# Patient Record
Sex: Male | Born: 1970 | Race: White | Hispanic: No | Marital: Married | Smoking: Never smoker
Health system: Southern US, Community
[De-identification: ages and names within clinical notes are randomized; demographics above are authoritative.]

---

## 1999-12-23 ENCOUNTER — Emergency Department (HOSPITAL_COMMUNITY): Admission: EM | Admit: 1999-12-23 | Discharge: 1999-12-23 | Payer: Self-pay | Admitting: Emergency Medicine

## 2016-01-07 ENCOUNTER — Emergency Department (HOSPITAL_BASED_OUTPATIENT_CLINIC_OR_DEPARTMENT_OTHER)
Admission: EM | Admit: 2016-01-07 | Discharge: 2016-01-07 | Disposition: A | Discharge status: Home / Self Care | Payer: 59 | Attending: Emergency Medicine | Admitting: Emergency Medicine

## 2016-01-07 ENCOUNTER — Encounter (HOSPITAL_BASED_OUTPATIENT_CLINIC_OR_DEPARTMENT_OTHER): Payer: Self-pay | Admitting: Emergency Medicine

## 2016-01-07 DIAGNOSIS — L03011 Cellulitis of right finger: Secondary | ICD-10-CM | POA: Insufficient documentation

## 2016-01-07 DIAGNOSIS — R0789 Other chest pain: Secondary | ICD-10-CM | POA: Diagnosis present

## 2016-01-07 DIAGNOSIS — R002 Palpitations: Secondary | ICD-10-CM | POA: Insufficient documentation

## 2016-01-07 LAB — CBC WITH DIFFERENTIAL/PLATELET
Basophils Absolute: 0 10*3/uL (ref 0.0–0.1)
Basophils Relative: 0 %
EOS PCT: 3 %
Eosinophils Absolute: 0.1 10*3/uL (ref 0.0–0.7)
HEMATOCRIT: 42.8 % (ref 39.0–52.0)
Hemoglobin: 15 g/dL (ref 13.0–17.0)
Lymphocytes Relative: 21 %
Lymphs Abs: 0.8 10*3/uL (ref 0.7–4.0)
MCH: 30.7 pg (ref 26.0–34.0)
MCHC: 35 g/dL (ref 30.0–36.0)
MCV: 87.7 fL (ref 78.0–100.0)
MONO ABS: 0.5 10*3/uL (ref 0.1–1.0)
MONOS PCT: 13 %
NEUTROS PCT: 63 %
Neutro Abs: 2.5 10*3/uL (ref 1.7–7.7)
PLATELETS: 113 10*3/uL — AB (ref 150–400)
RBC: 4.88 MIL/uL (ref 4.22–5.81)
RDW: 13.1 % (ref 11.5–15.5)
WBC: 3.9 10*3/uL — AB (ref 4.0–10.5)

## 2016-01-07 LAB — TROPONIN I: Troponin I: <0.03 ng/mL (ref <0.03)

## 2016-01-07 LAB — BASIC METABOLIC PANEL
ANION GAP: 15 (ref 5–15)
BUN: 8 mg/dL (ref 6–20)
CHLORIDE: 98 mmol/L — AB (ref 101–111)
CO2: 24 mmol/L (ref 22–32)
Calcium: 9.3 mg/dL (ref 8.9–10.3)
Creatinine, Ser: 0.76 mg/dL (ref 0.61–1.24)
GFR calc non Af Amer: >60 mL/min (ref >60)
Glucose, Bld: 111 mg/dL — ABNORMAL HIGH (ref 65–99)
POTASSIUM: 3.9 mmol/L (ref 3.5–5.1)
SODIUM: 137 mmol/L (ref 135–145)

## 2016-01-07 MED ORDER — CEPHALEXIN 500 MG PO CAPS: 500.0000 mg | ORAL_CAPSULE | Freq: Four times a day (QID) | ORAL | Status: DC

## 2016-01-07 MED ORDER — LORAZEPAM 2 MG/ML IJ SOLN
1.0000 mg | Freq: Once | INTRAMUSCULAR | Status: AC
  Administered 2016-01-07: 1 mg via INTRAVENOUS
  Filled 2016-01-07: qty 1

## 2016-01-07 MED ORDER — LORAZEPAM 1 MG PO TABS: 1.0000 mg | ORAL_TABLET | Freq: Three times a day (TID) | ORAL | Status: DC | PRN

## 2016-01-07 NOTE — Discharge Instructions (Signed)
Keflex as prescribed.  Ativan as prescribed as needed.  Return to the emergency department if you develop severe chest pain, difficulty breathing, or other new and concerning symptoms.   Palpitations A palpitation is the feeling that your heartbeat is irregular or is faster than normal. It may feel like your heart is fluttering or skipping a beat. Palpitations are usually not a serious problem. However, in some cases, you may need further medical evaluation. CAUSES  Palpitations can be caused by:  Smoking.  Caffeine or other stimulants, such as diet pills or energy drinks.  Alcohol.  Stress and anxiety.  Strenuous physical activity.  Fatigue.  Certain medicines.  Heart disease, especially if you have a history of irregular heart rhythms (arrhythmias), such as atrial fibrillation, atrial flutter, or supraventricular tachycardia.  An improperly working pacemaker or defibrillator. DIAGNOSIS  To find the cause of your palpitations, your health care provider will take your medical history and perform a physical exam. Your health care provider may also have you take a test called an ambulatory electrocardiogram (ECG). An ECG records your heartbeat patterns over a 24-hour period. You may also have other tests, such as:  Transthoracic echocardiogram (TTE). During echocardiography, sound waves are used to evaluate how blood flows through your heart.  Transesophageal echocardiogram (TEE).  Cardiac monitoring. This allows your health care provider to monitor your heart rate and rhythm in real time.  Holter monitor. This is a portable device that records your heartbeat and can help diagnose heart arrhythmias. It allows your health care provider to track your heart activity for several days, if needed.  Stress tests by exercise or by giving medicine that makes the heart beat faster. TREATMENT  Treatment of palpitations depends on the cause of your symptoms and can vary greatly. Most  cases of palpitations do not require any treatment other than time, relaxation, and monitoring your symptoms. Other causes, such as atrial fibrillation, atrial flutter, or supraventricular tachycardia, usually require further treatment. HOME CARE INSTRUCTIONS   Avoid:  Caffeinated coffee, tea, soft drinks, diet pills, and energy drinks.  Chocolate.  Alcohol.  Stop smoking if you smoke.  Reduce your stress and anxiety. Things that can help you relax include:  A method of controlling things in your body, such as your heartbeats, with your mind (biofeedback).  Yoga.  Meditation.  Physical activity such as swimming, jogging, or walking.  Get plenty of rest and sleep. SEEK MEDICAL CARE IF:   You continue to have a fast or irregular heartbeat beyond 24 hours.  Your palpitations occur more often. SEEK IMMEDIATE MEDICAL CARE IF:  You have chest pain or shortness of breath.  You have a severe headache.  You feel dizzy or you faint. MAKE SURE YOU:  Understand these instructions.  Will watch your condition.  Will get help right away if you are not doing well or get worse.   This information is not intended to replace advice given to you by your health care provider. Make sure you discuss any questions you have with your health care provider.   Document Released: 06/20/2000 Document Revised: 06/28/2013 Document Reviewed: 08/22/2011 Elsevier Interactive Patient Education 2016 Else[employer] Inc[employer]  Cellulitis Cellulitis is an infection of the skin and the tissue beneath it. The infected area is usually red and tender. Cellulitis occurs most often in the arms and lower legs.  CAUSES  Cellulitis is caused by bacteria that enter the skin through cracks or cuts in the skin. The most common types of bacteria that  cause cellulitis are staphylococci and streptococci. SIGNS AND SYMPTOMS   Redness and warmth.  Swelling.  Tenderness or pain.  Fever. DIAGNOSIS  Your health care  provider can usually determine what is wrong based on a physical exam. Blood tests may also be done. TREATMENT  Treatment usually involves taking an antibiotic medicine. HOME CARE INSTRUCTIONS   Take your antibiotic medicine as directed by your health care provider. Finish the antibiotic even if you start to feel better.  Keep the infected arm or leg elevated to reduce swelling.  Apply a warm cloth to the affected area up to 4 times per day to relieve pain.  Take medicines only as directed by your health care provider.  Keep all follow-up visits as directed by your health care provider. SEEK MEDICAL CARE IF:   You notice red streaks coming from the infected area.  Your red area gets larger or turns dark in color.  Your bone or joint underneath the infected area becomes painful after the skin has healed.  Your infection returns in the same area or another area.  You notice a swollen bump in the infected area.  You develop new symptoms.  You have a fever. SEEK IMMEDIATE MEDICAL CARE IF:   You feel very sleepy.  You develop vomiting or diarrhea.  You have a general ill feeling (malaise) with muscle aches and pains.   This information is not intended to replace advice given to you by your health care provider. Make sure you discuss any questions you have with your health care provider.   Document Released: 04/02/2005 Document Revised: 03/14/2015 Document Reviewed: 09/08/2011 Elsevier Interactive Patient Education [employer] Inc[employer]

## 2016-01-07 NOTE — ED Notes (Addendum)
Patient reports feeling shaky all over as well as c/o chest pain.  Reports this started this morning and has caused some shortness of breath. States he did not have coffee or energy drinks this morning and only drank water this morning.  Reports returning from vacation yesterday-flew from [place].  Reports he has been checking his heart rate with his watch and it has "been jumping all over the place".  Patient states "it feels like I drank too much alcohol".  States he did drink in excess on vacation but only had 2 beers at the airport yesterday.

## 2016-01-07 NOTE — ED Provider Notes (Signed)
CSN: [id number]     Arrival date & time 01/07/16  [id number] History   First MD Initiated Contact with Patient 01/07/16 [phone]     Chief Complaint  Patient presents with  . Chest Pain     (Consider location/radiation/quality/duration/timing/severity/associated sxs/prior Treatment) HPI Comments: Patient is a 45 year old male with no significant past medical history. He presents for evaluation of palpitations and tightness in his chest. He reports feeling anxious, unsettled, and shaky all over. This started approximately 3:00 this morning. Patient reports he spent the week in [place] visiting family and returned home yesterday. While he was there, he reports drinking in excess of 10 beers per day. He denies daily alcohol use otherwise. He is concerned he may have drank too much while he was away. He denies any shortness of breath, nausea, diaphoresis, or radiation to the arm or jaw. He denies any recent exertional symptoms. He has no cardiac history, no cardiac risk factors, and reports mountain biking several times weekly with no chest pain or dyspnea.  Patient is a 45 y.o. male presenting with chest pain. The history is provided by the patient.  Chest Pain Pain location:  Substernal area Pain quality: tightness   Pain radiates to:  Does not radiate Pain radiates to the back: no   Pain severity:  Mild Timing:  Constant Progression:  Unchanged Chronicity:  New Relieved by:  Nothing Worsened by:  Nothing tried Ineffective treatments:  None tried   History reviewed. No pertinent past medical history. History reviewed. No pertinent past surgical history. History reviewed. No pertinent family history. Social History  Substance Use Topics  . Smoking status: Never Smoker   . Smokeless tobacco: None  . Alcohol Use: Yes     Comment: 6-8 beers weekly    Review of Systems  Cardiovascular: Positive for chest pain.  All other systems reviewed and are negative.     Allergies  Review of  patient's allergies indicates no known allergies.  Home Medications   Prior to Admission medications   Not on File   BP 159/76 mmHg  Pulse 80  Temp(Src) 98.6 F (37 C) (Oral)  Resp 18  Ht 5\' 9"  (1.753 m)  Wt 142 lb (64.411 kg)  BMI 20.96 kg/m2  SpO2 100% Physical Exam  Constitutional: He is oriented to person, place, and time. He appears well-developed and well-nourished. No distress.  He does appear somewhat shaky and anxious.  HENT:  Head: Normocephalic and atraumatic.  Mouth/Throat: Oropharynx is clear and moist.  Neck: Normal range of motion. Neck supple.  Cardiovascular: Normal rate and regular rhythm.  Exam reveals no friction rub.   No murmur heard. Pulmonary/Chest: Effort normal and breath sounds normal. No respiratory distress. He has no wheezes. He has no rales.  Abdominal: Soft. Bowel sounds are normal. He exhibits no distension. There is no tenderness.  Musculoskeletal: Normal range of motion. He exhibits no edema.  Neurological: He is alert and oriented to person, place, and time. No cranial nerve deficit. He exhibits normal muscle tone. Coordination normal.  Skin: Skin is warm and dry. He is not diaphoretic.  Nursing note and vitals reviewed.   ED Course  Procedures (including critical care time) Labs Review Labs Reviewed  BASIC METABOLIC PANEL  CBC WITH DIFFERENTIAL/PLATELET  TROPONIN I    Imaging Review No results found. I have personally reviewed and evaluated these images and lab results as part of my medical decision-making.   EKG Interpretation   Date/Time:  Monday January 07 2016 09:32:49  EDT Ventricular Rate:  81 PR Interval:    QRS Duration: 94 QT Interval:  390 QTC Calculation: 453 R Axis:   90 Text Interpretation:  Sinus rhythm Borderline right axis deviation  Confirmed by Everlynn Sagun  MD, Dyke Weible ([id number]) on 01/07/2016 9:41:40 AM      MDM   Final diagnoses:  None    Patient presents with complaints of palpitations and feeling shaky. He  also feels tight in the chest. He returned yesterday from a vacation during which he reports drinking approximately 10 beers per day. He has had no ectopy while in the emergency department and he has been in a sinus rhythm. I highly doubt a cardiac etiology and do not feel as though any further workup is indicated into this. His symptoms are most likely related to alcohol withdrawal as he is feeling much better after dose of Ativan. He will be discharged with Ativan which she can take if his symptoms recur. I will also prescribe Keflex as he is reporting redness in his right thumb. He also feels as though there may be a splinter there. He does have a small hold the tip of the right thumb, however I am unable to appreciate any foreign body.    Geoffery Lyonsouglas Charlette Hennings, MD 01/07/16 [phone]

## 2016-10-27 ENCOUNTER — Emergency Department (HOSPITAL_BASED_OUTPATIENT_CLINIC_OR_DEPARTMENT_OTHER): Payer: 59

## 2016-10-27 ENCOUNTER — Emergency Department (HOSPITAL_BASED_OUTPATIENT_CLINIC_OR_DEPARTMENT_OTHER)
Admission: EM | Admit: 2016-10-27 | Discharge: 2016-10-27 | Disposition: A | Discharge status: Home / Self Care | Payer: 59 | Attending: Emergency Medicine | Admitting: Emergency Medicine

## 2016-10-27 ENCOUNTER — Encounter (HOSPITAL_BASED_OUTPATIENT_CLINIC_OR_DEPARTMENT_OTHER): Payer: Self-pay | Admitting: Emergency Medicine

## 2016-10-27 DIAGNOSIS — S5011XA Contusion of right forearm, initial encounter: Secondary | ICD-10-CM | POA: Insufficient documentation

## 2016-10-27 DIAGNOSIS — Y999 Unspecified external cause status: Secondary | ICD-10-CM | POA: Diagnosis not present

## 2016-10-27 DIAGNOSIS — S6991XA Unspecified injury of right wrist, hand and finger(s), initial encounter: Secondary | ICD-10-CM | POA: Diagnosis present

## 2016-10-27 DIAGNOSIS — Y939 Activity, unspecified: Secondary | ICD-10-CM | POA: Insufficient documentation

## 2016-10-27 DIAGNOSIS — Y92410 Unspecified street and highway as the place of occurrence of the external cause: Secondary | ICD-10-CM | POA: Insufficient documentation

## 2016-10-27 DIAGNOSIS — M546 Pain in thoracic spine: Secondary | ICD-10-CM | POA: Insufficient documentation

## 2016-10-27 DIAGNOSIS — F17220 Nicotine dependence, chewing tobacco, uncomplicated: Secondary | ICD-10-CM | POA: Insufficient documentation

## 2016-10-27 DIAGNOSIS — S60221A Contusion of right hand, initial encounter: Secondary | ICD-10-CM

## 2016-10-27 NOTE — Discharge Instructions (Signed)
Read the information below.  You may return to the Emergency Department at any time for worsening condition or any new symptoms that concern you.  If you develop redness, swelling, pus draining from the wound, or fevers greater than 100.4, return to the ER immediately for a recheck.   °
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
## 2017-03-11 DIAGNOSIS — M25511 Pain in right shoulder: Secondary | ICD-10-CM | POA: Diagnosis not present

## 2017-03-11 DIAGNOSIS — M9901 Segmental and somatic dysfunction of cervical region: Secondary | ICD-10-CM | POA: Diagnosis not present

## 2017-03-16 DIAGNOSIS — M25511 Pain in right shoulder: Secondary | ICD-10-CM | POA: Diagnosis not present

## 2017-03-16 DIAGNOSIS — M9901 Segmental and somatic dysfunction of cervical region: Secondary | ICD-10-CM | POA: Diagnosis not present

## 2017-03-23 DIAGNOSIS — M9901 Segmental and somatic dysfunction of cervical region: Secondary | ICD-10-CM | POA: Diagnosis not present

## 2017-03-23 DIAGNOSIS — M25511 Pain in right shoulder: Secondary | ICD-10-CM | POA: Diagnosis not present

## 2017-03-25 DIAGNOSIS — M25511 Pain in right shoulder: Secondary | ICD-10-CM | POA: Diagnosis not present

## 2017-03-25 DIAGNOSIS — M9901 Segmental and somatic dysfunction of cervical region: Secondary | ICD-10-CM | POA: Diagnosis not present

## 2017-03-30 DIAGNOSIS — M25511 Pain in right shoulder: Secondary | ICD-10-CM | POA: Diagnosis not present

## 2017-03-30 DIAGNOSIS — M9901 Segmental and somatic dysfunction of cervical region: Secondary | ICD-10-CM | POA: Diagnosis not present

## 2017-04-01 DIAGNOSIS — M25511 Pain in right shoulder: Secondary | ICD-10-CM | POA: Diagnosis not present

## 2017-04-01 DIAGNOSIS — M9901 Segmental and somatic dysfunction of cervical region: Secondary | ICD-10-CM | POA: Diagnosis not present

## 2017-04-02 DIAGNOSIS — M25511 Pain in right shoulder: Secondary | ICD-10-CM | POA: Diagnosis not present

## 2017-04-02 DIAGNOSIS — M9901 Segmental and somatic dysfunction of cervical region: Secondary | ICD-10-CM | POA: Diagnosis not present

## 2017-04-06 DIAGNOSIS — M9901 Segmental and somatic dysfunction of cervical region: Secondary | ICD-10-CM | POA: Diagnosis not present

## 2017-04-06 DIAGNOSIS — M25511 Pain in right shoulder: Secondary | ICD-10-CM | POA: Diagnosis not present

## 2017-04-08 DIAGNOSIS — M9901 Segmental and somatic dysfunction of cervical region: Secondary | ICD-10-CM | POA: Diagnosis not present

## 2017-04-08 DIAGNOSIS — M25511 Pain in right shoulder: Secondary | ICD-10-CM | POA: Diagnosis not present

## 2017-04-13 DIAGNOSIS — M9901 Segmental and somatic dysfunction of cervical region: Secondary | ICD-10-CM | POA: Diagnosis not present

## 2017-04-13 DIAGNOSIS — M25511 Pain in right shoulder: Secondary | ICD-10-CM | POA: Diagnosis not present

## 2017-04-16 DIAGNOSIS — M9901 Segmental and somatic dysfunction of cervical region: Secondary | ICD-10-CM | POA: Diagnosis not present

## 2017-04-16 DIAGNOSIS — M25511 Pain in right shoulder: Secondary | ICD-10-CM | POA: Diagnosis not present

## 2017-04-20 DIAGNOSIS — M25511 Pain in right shoulder: Secondary | ICD-10-CM | POA: Diagnosis not present

## 2017-04-20 DIAGNOSIS — M9901 Segmental and somatic dysfunction of cervical region: Secondary | ICD-10-CM | POA: Diagnosis not present

## 2017-04-23 DIAGNOSIS — M25511 Pain in right shoulder: Secondary | ICD-10-CM | POA: Diagnosis not present

## 2017-04-23 DIAGNOSIS — M9901 Segmental and somatic dysfunction of cervical region: Secondary | ICD-10-CM | POA: Diagnosis not present

## 2017-04-28 DIAGNOSIS — M9901 Segmental and somatic dysfunction of cervical region: Secondary | ICD-10-CM | POA: Diagnosis not present

## 2017-04-28 DIAGNOSIS — M25511 Pain in right shoulder: Secondary | ICD-10-CM | POA: Diagnosis not present

## 2017-05-04 DIAGNOSIS — M25511 Pain in right shoulder: Secondary | ICD-10-CM | POA: Diagnosis not present

## 2017-05-04 DIAGNOSIS — M9901 Segmental and somatic dysfunction of cervical region: Secondary | ICD-10-CM | POA: Diagnosis not present

## 2017-05-07 DIAGNOSIS — M25511 Pain in right shoulder: Secondary | ICD-10-CM | POA: Diagnosis not present

## 2017-05-07 DIAGNOSIS — M9901 Segmental and somatic dysfunction of cervical region: Secondary | ICD-10-CM | POA: Diagnosis not present

## 2017-05-20 DIAGNOSIS — M25511 Pain in right shoulder: Secondary | ICD-10-CM | POA: Diagnosis not present

## 2017-05-20 DIAGNOSIS — M9901 Segmental and somatic dysfunction of cervical region: Secondary | ICD-10-CM | POA: Diagnosis not present

## 2017-06-03 DIAGNOSIS — M25511 Pain in right shoulder: Secondary | ICD-10-CM | POA: Diagnosis not present

## 2017-06-03 DIAGNOSIS — M9901 Segmental and somatic dysfunction of cervical region: Secondary | ICD-10-CM | POA: Diagnosis not present

## 2017-07-28 DIAGNOSIS — J0140 Acute pansinusitis, unspecified: Secondary | ICD-10-CM | POA: Diagnosis not present

## 2017-07-28 DIAGNOSIS — H6503 Acute serous otitis media, bilateral: Secondary | ICD-10-CM | POA: Diagnosis not present

## 2017-11-18 DIAGNOSIS — R55 Syncope and collapse: Secondary | ICD-10-CM | POA: Diagnosis not present

## 2017-11-18 DIAGNOSIS — R7989 Other specified abnormal findings of blood chemistry: Secondary | ICD-10-CM | POA: Diagnosis not present

## 2017-11-18 DIAGNOSIS — Z6822 Body mass index (BMI) 22.0-22.9, adult: Secondary | ICD-10-CM | POA: Diagnosis not present

## 2017-11-18 DIAGNOSIS — Z8549 Personal history of malignant neoplasm of other male genital organs: Secondary | ICD-10-CM | POA: Diagnosis not present

## 2018-02-18 DIAGNOSIS — M25552 Pain in left hip: Secondary | ICD-10-CM | POA: Diagnosis not present

## 2018-02-18 DIAGNOSIS — M545 Low back pain: Secondary | ICD-10-CM | POA: Diagnosis not present

## 2018-02-26 DIAGNOSIS — S0033XA Contusion of nose, initial encounter: Secondary | ICD-10-CM | POA: Diagnosis not present

## 2018-02-26 DIAGNOSIS — S0121XA Laceration without foreign body of nose, initial encounter: Secondary | ICD-10-CM | POA: Diagnosis not present

## 2018-02-26 DIAGNOSIS — G501 Atypical facial pain: Secondary | ICD-10-CM | POA: Diagnosis not present

## 2018-02-26 DIAGNOSIS — S0992XA Unspecified injury of nose, initial encounter: Secondary | ICD-10-CM | POA: Diagnosis not present

## 2018-07-13 DIAGNOSIS — Z0000 Encounter for general adult medical examination without abnormal findings: Secondary | ICD-10-CM | POA: Diagnosis not present

## 2018-07-13 DIAGNOSIS — Z125 Encounter for screening for malignant neoplasm of prostate: Secondary | ICD-10-CM | POA: Diagnosis not present

## 2018-07-20 DIAGNOSIS — Z8249 Family history of ischemic heart disease and other diseases of the circulatory system: Secondary | ICD-10-CM | POA: Diagnosis not present

## 2018-07-20 DIAGNOSIS — Z0000 Encounter for general adult medical examination without abnormal findings: Secondary | ICD-10-CM | POA: Diagnosis not present

## 2018-07-20 DIAGNOSIS — J302 Other seasonal allergic rhinitis: Secondary | ICD-10-CM | POA: Diagnosis not present

## 2018-07-20 DIAGNOSIS — R7989 Other specified abnormal findings of blood chemistry: Secondary | ICD-10-CM | POA: Diagnosis not present

## 2018-07-20 DIAGNOSIS — Z1389 Encounter for screening for other disorder: Secondary | ICD-10-CM | POA: Diagnosis not present

## 2018-07-20 DIAGNOSIS — E871 Hypo-osmolality and hyponatremia: Secondary | ICD-10-CM | POA: Diagnosis not present

## 2018-08-30 DIAGNOSIS — M25552 Pain in left hip: Secondary | ICD-10-CM | POA: Diagnosis not present

## 2018-09-07 DIAGNOSIS — M545 Low back pain: Secondary | ICD-10-CM | POA: Diagnosis not present

## 2018-09-09 DIAGNOSIS — M545 Low back pain: Secondary | ICD-10-CM | POA: Diagnosis not present
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
## 2018-10-26 DIAGNOSIS — M4316 Spondylolisthesis, lumbar region: Secondary | ICD-10-CM | POA: Diagnosis not present

## 2018-10-26 DIAGNOSIS — M545 Low back pain: Secondary | ICD-10-CM | POA: Diagnosis not present

## 2018-10-26 DIAGNOSIS — M5416 Radiculopathy, lumbar region: Secondary | ICD-10-CM | POA: Diagnosis not present

## 2018-12-12 ENCOUNTER — Emergency Department (HOSPITAL_COMMUNITY)
Admission: EM | Admit: 2018-12-12 | Discharge: 2018-12-12 | Disposition: A | Discharge status: Home / Self Care | Payer: BC Managed Care – PPO | Attending: Emergency Medicine | Admitting: Emergency Medicine

## 2018-12-12 ENCOUNTER — Emergency Department (HOSPITAL_COMMUNITY): Payer: BC Managed Care – PPO

## 2018-12-12 ENCOUNTER — Encounter (HOSPITAL_COMMUNITY): Payer: Self-pay

## 2018-12-12 ENCOUNTER — Other Ambulatory Visit: Payer: Self-pay

## 2018-12-12 DIAGNOSIS — F17220 Nicotine dependence, chewing tobacco, uncomplicated: Secondary | ICD-10-CM | POA: Diagnosis not present

## 2018-12-12 DIAGNOSIS — R7989 Other specified abnormal findings of blood chemistry: Secondary | ICD-10-CM

## 2018-12-12 DIAGNOSIS — R945 Abnormal results of liver function studies: Secondary | ICD-10-CM | POA: Diagnosis not present

## 2018-12-12 DIAGNOSIS — R55 Syncope and collapse: Secondary | ICD-10-CM | POA: Diagnosis not present

## 2018-12-12 DIAGNOSIS — R112 Nausea with vomiting, unspecified: Secondary | ICD-10-CM

## 2018-12-12 LAB — URINALYSIS, ROUTINE W REFLEX MICROSCOPIC
Bacteria, UA: NONE SEEN
Bilirubin Urine: NEGATIVE
Glucose, UA: NEGATIVE mg/dL
Hgb urine dipstick: NEGATIVE
Ketones, ur: 20 mg/dL — AB
Leukocytes,Ua: NEGATIVE
Nitrite: NEGATIVE
Protein, ur: 30 mg/dL — AB
Specific Gravity, Urine: 1.006 (ref 1.005–1.030)
pH: 8 (ref 5.0–8.0)

## 2018-12-12 LAB — BASIC METABOLIC PANEL
Anion gap: 17 — ABNORMAL HIGH (ref 5–15)
BUN: 5 mg/dL — ABNORMAL LOW (ref 6–20)
CO2: 22 mmol/L (ref 22–32)
Calcium: 9.6 mg/dL (ref 8.9–10.3)
Chloride: 88 mmol/L — ABNORMAL LOW (ref 98–111)
Creatinine, Ser: 0.6 mg/dL — ABNORMAL LOW (ref 0.61–1.24)
GFR calc Af Amer: >60 mL/min (ref >60)
GFR calc non Af Amer: >60 mL/min (ref >60)
Glucose, Bld: 121 mg/dL — ABNORMAL HIGH (ref 70–99)
Potassium: 3.6 mmol/L (ref 3.5–5.1)
Sodium: 127 mmol/L — ABNORMAL LOW (ref 135–145)

## 2018-12-12 LAB — HEPATIC FUNCTION PANEL
ALT: 159 U/L — ABNORMAL HIGH (ref 0–44)
AST: 214 U/L — ABNORMAL HIGH (ref 15–41)
Albumin: 5.1 g/dL — ABNORMAL HIGH (ref 3.5–5.0)
Alkaline Phosphatase: 80 U/L (ref 38–126)
Bilirubin, Direct: 0.4 mg/dL — ABNORMAL HIGH (ref 0.0–0.2)
Indirect Bilirubin: 1.2 mg/dL — ABNORMAL HIGH (ref 0.3–0.9)
Total Bilirubin: 1.6 mg/dL — ABNORMAL HIGH (ref 0.3–1.2)
Total Protein: 8.3 g/dL — ABNORMAL HIGH (ref 6.5–8.1)

## 2018-12-12 LAB — LIPASE, BLOOD: Lipase: 42 U/L (ref 11–51)

## 2018-12-12 LAB — CBC
HCT: 41 % (ref 39.0–52.0)
Hemoglobin: 14.7 g/dL (ref 13.0–17.0)
MCH: 31 pg (ref 26.0–34.0)
MCHC: 35.9 g/dL (ref 30.0–36.0)
MCV: 86.5 fL (ref 80.0–100.0)
Platelets: 146 10*3/uL — ABNORMAL LOW (ref 150–400)
RBC: 4.74 MIL/uL (ref 4.22–5.81)
RDW: 12.3 % (ref 11.5–15.5)
WBC: 6.1 10*3/uL (ref 4.0–10.5)
nRBC: 0 % (ref 0.0–0.2)

## 2018-12-12 LAB — CBG MONITORING, ED: Glucose-Capillary: 108 mg/dL — ABNORMAL HIGH (ref 70–99)

## 2018-12-12 MED ORDER — ONDANSETRON HCL 4 MG/2ML IJ SOLN
4.0000 mg | Freq: Once | INTRAMUSCULAR | Status: AC
  Administered 2018-12-12: 4 mg via INTRAVENOUS
  Filled 2018-12-12: qty 2

## 2018-12-12 MED ORDER — ONDANSETRON 4 MG PO TBDP: 4.0000 mg | ORAL_TABLET | Freq: Three times a day (TID) | ORAL | 0 refills | Status: AC | PRN

## 2018-12-12 MED ORDER — SODIUM CHLORIDE 0.9 % IV BOLUS
1000.0000 mL | Freq: Once | INTRAVENOUS | Status: AC
Start: 2018-12-12 — End: 2018-12-12
  Administered 2018-12-12: 1000 mL via INTRAVENOUS

## 2018-12-12 MED ORDER — SODIUM CHLORIDE 0.9% FLUSH: 3.0000 mL | Freq: Once | INTRAVENOUS | Status: DC

## 2018-12-12 MED ORDER — SODIUM CHLORIDE 0.9 % IV BOLUS
1000.0000 mL | Freq: Once | INTRAVENOUS | Status: AC
  Administered 2018-12-12: 1000 mL via INTRAVENOUS

## 2018-12-12 NOTE — ED Provider Notes (Signed)
[facility] Emergency Department Provider Note MRN:  [id number]  Arrival date & time: 12/12/18     Chief Complaint   Loss of Consciousness   History of Present Illness   Tyler Knight is a 48 y.o. year-old male with no pertinent past medical history presenting to the ED with chief complaint of loss of consciousness.  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  Unconscious for several moments, quick return to baseline.  Denies trauma.  Last night had 2-3 episodes of nonbloody nonbilious emesis, general malaise, generalized abdominal discomfort, denies diarrhea, no fever, no chest pain or shortness of breath preceding or after the syncopal event.  General malaise is constant, no exacerbating or relieving factors.  Review of Systems  A complete 10 system review of systems was obtained and all systems are negative except as noted in the HPI and PMH.   Patient's Health History   History reviewed. No pertinent past medical history.  History reviewed. No pertinent surgical history.  No family history on file.  Social History   Socioeconomic History  . Marital status: Single    Spouse name: Not on file  . Number of children: Not on file  . Years of education: Not on file  . Highest education level: Not on file  Occupational History  . Not on file  Social Needs  . Financial resource strain: Not on file  . Food insecurity:    Worry: Not on file    Inability: Not on file  . Transportation needs:    Medical: Not on file    Non-medical: Not on file  Tobacco Use  . Smoking status: Never Smoker  . Smokeless tobacco: Current User    Types: Chew  Substance and Sexual Activity  . Alcohol use: Yes    Comment: 6-8 beers weekly  . Drug use: No  . Sexual activity: Not on file  Lifestyle  . Physical activity:   Days per week: Not on file    Minutes per session: Not on file  . Stress: Not on file  Relationships  . Social connections:    Talks on phone: Not on file    Gets together: Not on file    Attends religious service: Not on file    Active member of club or organization: Not on file    Attends meetings of clubs or organizations: Not on file    Relationship status: Not on file  . Intimate partner violence:    Fear of current or ex partner: Not on file    Emotionally abused: Not on file    Physically abused: Not on file    Forced sexual activity: Not on file  Other Topics Concern  . Not on file  Social History Narrative  . Not on file     Physical Exam  Vital Signs and Nursing Notes reviewed Vitals:   12/12/18 1330 12/12/18 1358  BP: (!) 155/86 (!) 155/86  Pulse: 69 69  Resp: 14 16  Temp:  98.7 F (37.1 C)  SpO2: 97% 97%    CONSTITUTIONAL: Well-appearing, NAD NEURO:  Alert and oriented x 3, no focal deficits EYES:  eyes equal and reactive ENT/NECK:  no LAD, no JVD CARDIO: Regular rate, well-perfused, normal S1 and S2 PULM:  CTAB no wheezing or rhonchi GI/GU:  normal bowel sounds,  non-distended, non-tender MSK/SPINE:  No gross deformities, no edema SKIN:  no rash, atraumatic PSYCH:  Appropriate speech and behavior  Diagnostic and Interventional Summary    EKG Interpretation  Date/Time:  Sunday December 12 2018 08:11:12 EDT Ventricular Rate:  75 PR Interval:    QRS Duration: 98 QT Interval:  412 QTC Calculation: 461 R Axis:   74 Text Interpretation:  Atrial fibrillation ST elev, probable normal early repol pattern Confirmed by Gerlene Fee [phone]) on 12/12/2018 8:16:12 AM      Labs Reviewed  BASIC METABOLIC PANEL - Abnormal; Notable for the following components:      Result Value   Sodium 127 (*)    Chloride 88 (*)    Glucose, Bld 121 (*)    BUN 5 (*)    Creatinine, Ser 0.60 (*)    Anion gap 17 (*)    All other components within normal limits  CBC - Abnormal;  Notable for the following components:   Platelets 146 (*)    All other components within normal limits  URINALYSIS, ROUTINE W REFLEX MICROSCOPIC - Abnormal; Notable for the following components:   Ketones, ur 20 (*)    Protein, ur 30 (*)    All other components within normal limits  HEPATIC FUNCTION PANEL - Abnormal; Notable for the following components:   Total Protein 8.3 (*)    Albumin 5.1 (*)    AST 214 (*)    ALT 159 (*)    Total Bilirubin 1.6 (*)    Bilirubin, Direct 0.4 (*)    Indirect Bilirubin 1.2 (*)    All other components within normal limits  CBG MONITORING, ED - Abnormal; Notable for the following components:   Glucose-Capillary 108 (*)    All other components within normal limits  LIPASE, BLOOD    US Abdomen Limited RUQ  Final Result      Medications  sodium chloride flush (NS) 0.9 % injection 3 mL (has no administration in time range)  sodium chloride 0.9 % bolus 1,000 mL (0 mLs Intravenous Stopped 12/12/18 1345)  ondansetron (ZOFRAN) injection 4 mg (4 mg Intravenous Given 12/12/18 0920)  sodium chloride 0.9 % bolus 1,000 mL (0 mLs Intravenous Stopped 12/12/18 1345)     Procedures Critical Care  ED Course and Medical Decision Making  I have reviewed the triage vital signs and the nursing notes.  Pertinent labs & imaging results that were available during my care of the patient were reviewed by me and considered in my medical decision making (see below for details).  Suspect benign syncopal event in the setting of highly stressful situation, possibly contributing would be a viral gastritis.  Patient is with normal vital signs, reassuring abdominal exam, normal neurological exam, EKG is unchanged with normal intervals, clear prodrome, quick return to baseline, inconsistent with arrhythmia or seizure.  Will provide IV fluids, check baseline labs, provide reassurance and anticipating discharge.  Labs reveal moderate hyponatremia, mild LFT elevation.  Patient was given  2 L normal saline, is feeling much better and requesting discharge.  Given the LFT elevation, right upper quadrant ultrasound was performed revealing no emergent findings, patient informed of the evidence of fatty liver, advised to decrease his weekly alcohol intake.  Advised increased hydration and salt intake at home, propria for discharge.  After the discussed management above, the patient was determined to be safe for discharge.  The patient was in agreement with this plan and all questions regarding their care were answered.  ED return precautions  were discussed and the patient will return to the ED with any significant worsening of condition.  Elmer SowMichael M. Pilar PlateBero, MD [facility] [email or web address]  Final Clinical Impressions(s) / ED Diagnoses     ICD-10-CM   1. Nausea and vomiting, intractability of vomiting not specified, unspecified vomiting type R11.2   2. LFT elevation R94.5 US Abdomen Limited RUQ    US Abdomen Limited RUQ    ED Discharge Orders         Ordered    ondansetron (ZOFRAN ODT) 4 MG disintegrating tablet  Every 8 hours PRN     12/12/18 1358             Sabas SousBero, Michael M, MD 12/12/18 1400

## 2018-12-12 NOTE — Discharge Instructions (Addendum)
You were evaluated in the Emergency Department and after careful evaluation, we did not find any emergent condition requiring admission or further testing in the hospital.  Your symptoms today seem to be due to a viral gastritis or inflammation of the stomach.  Your sodium levels were low today, but you should be able to correct these with continue hydration and increase salt in your diet at home for the next few days.  You can use the antinausea medication provided to help you eat and drink at home.  Please return to the Emergency Department if you experience any worsening of your condition.  We encourage you to follow up with a primary care provider.  Thank you for allowing [place] to be a part of your care.

## 2018-12-12 NOTE — ED Triage Notes (Signed)
Pt got married yesterday. Pt states that he passed out right before saying his vows. Pt states that he ate breakfast and lunch that day, along with water. Pt states that he is still feeling shaky today and has been experiencing leg cramps.

## 2018-12-12 NOTE — ED Notes (Signed)
Bed: WA04 Expected date:  Expected time:  Means of arrival:  Comments: 

## 2019-07-22 DIAGNOSIS — J302 Other seasonal allergic rhinitis: Secondary | ICD-10-CM | POA: Diagnosis not present

## 2019-07-22 DIAGNOSIS — R739 Hyperglycemia, unspecified: Secondary | ICD-10-CM | POA: Diagnosis not present

## 2019-07-22 DIAGNOSIS — Z125 Encounter for screening for malignant neoplasm of prostate: Secondary | ICD-10-CM | POA: Diagnosis not present

## 2019-07-22 DIAGNOSIS — Z0000 Encounter for general adult medical examination without abnormal findings: Secondary | ICD-10-CM | POA: Diagnosis not present

## 2019-07-22 DIAGNOSIS — E871 Hypo-osmolality and hyponatremia: Secondary | ICD-10-CM | POA: Diagnosis not present

## 2019-07-22 DIAGNOSIS — Z8249 Family history of ischemic heart disease and other diseases of the circulatory system: Secondary | ICD-10-CM | POA: Diagnosis not present

## 2019-07-22 DIAGNOSIS — R7989 Other specified abnormal findings of blood chemistry: Secondary | ICD-10-CM | POA: Diagnosis not present

## 2019-07-22 DIAGNOSIS — Z1331 Encounter for screening for depression: Secondary | ICD-10-CM | POA: Diagnosis not present

## 2019-07-28 ENCOUNTER — Other Ambulatory Visit: Payer: Self-pay | Admitting: Gastroenterology

## 2019-07-28 DIAGNOSIS — R748 Abnormal levels of other serum enzymes: Secondary | ICD-10-CM | POA: Diagnosis not present

## 2019-07-28 DIAGNOSIS — R945 Abnormal results of liver function studies: Secondary | ICD-10-CM

## 2019-07-28 DIAGNOSIS — R7989 Other specified abnormal findings of blood chemistry: Secondary | ICD-10-CM

## 2019-08-04 ENCOUNTER — Other Ambulatory Visit: Payer: 59

## 2019-08-08 ENCOUNTER — Ambulatory Visit
Admission: RE | Admit: 2019-08-08 | Discharge: 2019-08-08 | Disposition: A | Discharge status: Home / Self Care | Payer: BC Managed Care – PPO | Source: Ambulatory Visit | Attending: Gastroenterology | Admitting: Gastroenterology

## 2019-08-08 DIAGNOSIS — R945 Abnormal results of liver function studies: Secondary | ICD-10-CM

## 2019-08-08 DIAGNOSIS — R7989 Other specified abnormal findings of blood chemistry: Secondary | ICD-10-CM

## 2020-10-17 ENCOUNTER — Other Ambulatory Visit: Payer: Self-pay | Admitting: Ophthalmology

## 2020-10-17 DIAGNOSIS — H469 Unspecified optic neuritis: Secondary | ICD-10-CM

## 2020-10-20 ENCOUNTER — Ambulatory Visit
Admission: RE | Admit: 2020-10-20 | Discharge: 2020-10-20 | Disposition: A | Discharge status: Home / Self Care | Payer: Commercial Managed Care - PPO | Source: Ambulatory Visit | Attending: Ophthalmology | Admitting: Ophthalmology

## 2020-10-20 ENCOUNTER — Other Ambulatory Visit: Payer: Self-pay

## 2020-10-20 DIAGNOSIS — H469 Unspecified optic neuritis: Secondary | ICD-10-CM

## 2020-10-20 MED ORDER — GADOBENATE DIMEGLUMINE 529 MG/ML IV SOLN
14.0000 mL | Freq: Once | INTRAVENOUS | Status: AC | PRN
  Administered 2020-10-20: 14 mL via INTRAVENOUS
